# Patient Record
Sex: Female | Born: 1995 | Race: Black or African American | Hispanic: No | Marital: Single
Health system: Southern US, Community
[De-identification: ages and names within clinical notes are randomized; demographics above are authoritative.]

## PROBLEM LIST (undated history)

## (undated) DIAGNOSIS — Z8614 Personal history of Methicillin resistant Staphylococcus aureus infection: Secondary | ICD-10-CM

## (undated) DIAGNOSIS — Z789 Other specified health status: Secondary | ICD-10-CM

## (undated) DIAGNOSIS — S61219A Laceration without foreign body of unspecified finger without damage to nail, initial encounter: Secondary | ICD-10-CM

## (undated) HISTORY — PX: NO PAST SURGERIES: SHX2092

---
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
## 2015-10-14 ENCOUNTER — Ambulatory Visit (HOSPITAL_COMMUNITY)
Admission: EM | Admit: 2015-10-14 | Discharge: 2015-10-14 | Disposition: A | Discharge status: Home / Self Care | Payer: Commercial Managed Care - HMO | Attending: Emergency Medicine | Admitting: Emergency Medicine

## 2015-10-14 ENCOUNTER — Encounter (HOSPITAL_COMMUNITY): Payer: Self-pay | Admitting: *Deleted

## 2015-10-14 DIAGNOSIS — Z9889 Other specified postprocedural states: Secondary | ICD-10-CM | POA: Diagnosis not present

## 2015-10-14 DIAGNOSIS — K122 Cellulitis and abscess of mouth: Secondary | ICD-10-CM

## 2015-10-14 LAB — POCT RAPID STREP A: STREPTOCOCCUS, GROUP A SCREEN (DIRECT): NEGATIVE

## 2015-10-14 NOTE — ED Notes (Signed)
Pt  Was  Noted    Walking  Out    Of  The  Dept      After  The  Provider   Had  Seen  And   Spoke with  Her  About the  Plan of care

## 2015-10-14 NOTE — ED Provider Notes (Signed)
CSN: [id number]     Arrival date & time 10/14/15  1229 History   First MD Initiated Contact with Patient 10/14/15 1350     Chief Complaint  Patient presents with  . Dental Problem   (Consider location/radiation/quality/duration/timing/severity/associated sxs/prior Treatment) Patient presents today for Uvula pain. Her uvula pain started this morning. Patient woke up this morning with a weird taste in her mouth so she took a tongue scraper to clean her tongue and accidentally hit one of her tonsils (and maybe her uvula as well) during this process. She then started to spit up and then vomited once. She reports  that her uvula pain is better and pain is now 6/10 constant. Pain is worst during swallowing. She also endorses abdominal pain and headache this morning but both symptoms have subsided now.       Past Medical History:  Diagnosis Date  . History of MRSA infection ~ 2008   arm  . Laceration of finger of left hand with tendon involvement 09/10/2014   nerve/artery/tendon involvement  . Medical history non-contributory    Past Surgical History:  Procedure Laterality Date  . NERVE, TENDON AND ARTERY REPAIR Left 09/16/2014   Procedure: LEFT INDEX FINGER REPAIR TENDON, ARTERY AND NERVE;  Surgeon: Betha Loa, MD;  Location: [facility];  Service: Orthopedics;  Laterality: Left;  . NO PAST SURGERIES     History reviewed. No pertinent family history. Social History  Substance Use Topics  . Smoking status: Unknown If Ever Smoked  . Smokeless tobacco: Never Used  . Alcohol use No   OB History    No data available     Review of Systems  Constitutional: Negative for chills, fatigue and fever.  HENT: Positive for sore throat. Negative for congestion and dental problem.        Positive for uvula pain, swelling and redness   Respiratory: Negative for cough and shortness of breath.   Cardiovascular: Negative for chest pain.  Gastrointestinal: Positive for abdominal pain  and vomiting. Negative for nausea.       One episode of vomiting, abdominal pain subsided   Neurological: Positive for headaches.       Headache subsided     Allergies  Review of patient's allergies indicates no known allergies.  Home Medications   Prior to Admission medications   Not on File   Meds Ordered and Administered this Visit  Medications - No data to display  BP 104/69 (BP Location: Left Arm)   Pulse 61   Temp 98.5 F (36.9 C) (Oral)   Resp 12   LMP 10/09/2015   SpO2 99%  No data found.   Physical Exam  Constitutional: She is oriented to person, place, and time. She appears well-developed and well-nourished.  HENT:  Head: Normocephalic and atraumatic.  Ear canals clear, TM pearly gray without erythema, Uvula noted to be red and slightly swollen, mild pharyngeal erythema also noted. Tonsils 2+ without swelling or exudate.   Eyes: Conjunctivae and EOM are normal. Pupils are equal, round, and reactive to light.  Neck: Normal range of motion. Neck supple.  Cardiovascular: Normal rate, regular rhythm and normal heart sounds.   Pulmonary/Chest: Effort normal and breath sounds normal.  Abdominal: Soft. Bowel sounds are normal.  Lymphadenopathy:    She has no cervical adenopathy.  Neurological: She is alert and oriented to person, place, and time.  Skin: Skin is warm and dry.    Urgent Care Course   Clinical Course  Procedures (including critical care time)  Labs Review Labs Reviewed  POCT RAPID STREP A    Imaging Review No results found.   Visual Acuity Review  Right Eye Distance:   Left Eye Distance:   Bilateral Distance:    Right Eye Near:   Left Eye Near:    Bilateral Near:         MDM   1. Uvulitis    Patient presents today for sore throat and uvula pain. Her uvula was noted to be red and mildly swollen on examination. Rapid strep was negative. Etiology is either viral or trauma from the tongue scraper. Patient educated to treat  pain with honey, salt-water gargle, or throat lozenges.  Instructed to follow up with PCP if she does not improve.    [name], NP 10/14/15 1506

## 2015-10-16 LAB — CULTURE, GROUP A STREP (THRC)

## 2015-12-06 ENCOUNTER — Ambulatory Visit (HOSPITAL_COMMUNITY)
Admission: EM | Admit: 2015-12-06 | Discharge: 2015-12-06 | Disposition: A | Discharge status: Home / Self Care | Payer: Commercial Managed Care - HMO | Attending: Family Medicine | Admitting: Family Medicine

## 2015-12-06 ENCOUNTER — Encounter (HOSPITAL_COMMUNITY): Payer: Self-pay | Admitting: Emergency Medicine

## 2015-12-06 DIAGNOSIS — N390 Urinary tract infection, site not specified: Secondary | ICD-10-CM | POA: Insufficient documentation

## 2015-12-06 DIAGNOSIS — R350 Frequency of micturition: Secondary | ICD-10-CM | POA: Diagnosis present

## 2015-12-06 LAB — POCT URINALYSIS DIP (DEVICE)
BILIRUBIN URINE: NEGATIVE
Glucose, UA: NEGATIVE mg/dL
KETONES UR: NEGATIVE mg/dL
NITRITE: NEGATIVE
Protein, ur: NEGATIVE mg/dL
Specific Gravity, Urine: <=1.005 (ref 1.005–1.030)
Urobilinogen, UA: 0.2 mg/dL (ref 0.0–1.0)
pH: 5.5 (ref 5.0–8.0)

## 2015-12-06 MED ORDER — NITROFURANTOIN MONOHYD MACRO 100 MG PO CAPS: 100.0000 mg | ORAL_CAPSULE | Freq: Two times a day (BID) | ORAL | 0 refills | Status: AC

## 2015-12-06 NOTE — ED Triage Notes (Signed)
Pt. Stated, Sandra Atlasve been going to bathroom a lot but no pain.

## 2015-12-06 NOTE — ED Provider Notes (Signed)
CSN: [id number]     Arrival date & time 12/06/15  1927 History   First MD Initiated Contact with Patient 12/06/15 2010     Chief Complaint  Patient presents with  . Urinary Tract Infection  . Urinary Frequency   (Consider location/radiation/quality/duration/timing/severity/associated sxs/prior Treatment) Ms. Sandra Martinez is a well-appearing 20 y.o female, presents today for urinary frequency onset 3 days ago accompany by urinary urgency. She denies dysuria, vaginal discharge or flank pain. She reports currently on her menstrual cycle. She have tried OTC AZO with no relief. She is afebrile.       Past Medical History:  Diagnosis Date  . History of MRSA infection ~ 2008   arm  . Laceration of finger of left hand with tendon involvement 09/10/2014   nerve/artery/tendon involvement  . Medical history non-contributory    Past Surgical History:  Procedure Laterality Date  . NERVE, TENDON AND ARTERY REPAIR Left 09/16/2014   Procedure: LEFT INDEX FINGER REPAIR TENDON, ARTERY AND NERVE;  Surgeon: Betha LoaKevin Kuzma, MD;  Location: [facility];  Service: Orthopedics;  Laterality: Left;  . NO PAST SURGERIES     No family history on file. Social History  Substance Use Topics  . Smoking status: Unknown If Ever Smoked  . Smokeless tobacco: Never Used  . Alcohol use No   OB History    No data available     Review of Systems  Constitutional: Negative for chills and fever.  Respiratory: Negative for cough and shortness of breath.   Cardiovascular: Negative for chest pain and palpitations.  Gastrointestinal: Negative for abdominal pain, constipation, diarrhea and rectal pain.  Genitourinary: Positive for frequency, urgency and vaginal bleeding. Negative for dysuria, flank pain, vaginal discharge and vaginal pain.       +on menstrual cycle.   Neurological: Negative for dizziness and headaches.    Allergies  Review of patient's allergies indicates no known allergies.  Home Medications    Prior to Admission medications   Medication Sig Start Date End Date Taking? Authorizing Provider  nitrofurantoin, macrocrystal-monohydrate, (MACROBID) 100 MG capsule Take 1 capsule (100 mg total) by mouth 2 (two) times daily. 12/06/15 12/11/15  Lucia EstelleFeng Khaliah Barnick, NP   Meds Ordered and Administered this Visit  Medications - No data to display  BP 115/66 (BP Location: Right Arm)   Pulse 76   Temp 98.8 F (37.1 C) (Oral)   Resp 17   LMP 12/06/2015   SpO2 98%  No data found.   Physical Exam  Constitutional: She is oriented to person, place, and time. She appears well-developed.  HENT:  Head: Normocephalic and atraumatic.  Cardiovascular: Normal rate, regular rhythm and normal heart sounds.   Pulmonary/Chest: Effort normal and breath sounds normal. No respiratory distress. She has no wheezes.  Abdominal: Soft. Bowel sounds are normal. There is no tenderness.  Genitourinary:  Genitourinary Comments: -CVA tenderness  Neurological: She is alert and oriented to person, place, and time.  Skin: Skin is warm and dry.  Nursing note and vitals reviewed.   Urgent Care Course   Clinical Course    Procedures (including critical care time)  Labs Review Labs Reviewed  POCT URINALYSIS DIP (DEVICE) - Abnormal; Notable for the following:       Result Value   Hgb urine dipstick LARGE (*)    Leukocytes, UA SMALL (*)    All other components within normal limits  URINE CULTURE    Imaging Review No results found.     MDM   1.  UTI (lower urinary tract infection)    UA indicatives UTI. Large presence of hgb is due to her being on menstrual cycle. Urine culture pending. Will tx with macrobid BID x 5 days. All questions answered. Discharge instruction given.     [name], NP 12/06/15 2036

## 2015-12-08 LAB — URINE CULTURE: CULTURE: NO GROWTH

## 2016-01-17 ENCOUNTER — Emergency Department (HOSPITAL_COMMUNITY): Payer: Commercial Managed Care - HMO

## 2016-01-17 ENCOUNTER — Other Ambulatory Visit: Payer: Self-pay | Admitting: *Deleted

## 2016-01-17 ENCOUNTER — Encounter (HOSPITAL_COMMUNITY): Payer: Self-pay | Admitting: Emergency Medicine

## 2016-01-17 ENCOUNTER — Emergency Department (HOSPITAL_COMMUNITY)
Admission: EM | Admit: 2016-01-17 | Discharge: 2016-01-17 | Disposition: A | Discharge status: Home / Self Care | Payer: Commercial Managed Care - HMO | Attending: Emergency Medicine | Admitting: Emergency Medicine

## 2016-01-17 ENCOUNTER — Encounter (HOSPITAL_COMMUNITY): Payer: Self-pay | Admitting: *Deleted

## 2016-01-17 ENCOUNTER — Ambulatory Visit (HOSPITAL_COMMUNITY)
Admission: EM | Admit: 2016-01-17 | Discharge: 2016-01-17 | Disposition: A | Discharge status: Home / Self Care | Payer: Commercial Managed Care - HMO | Attending: Emergency Medicine | Admitting: Emergency Medicine

## 2016-01-17 DIAGNOSIS — R1011 Right upper quadrant pain: Secondary | ICD-10-CM | POA: Diagnosis present

## 2016-01-17 DIAGNOSIS — M25511 Pain in right shoulder: Secondary | ICD-10-CM | POA: Diagnosis not present

## 2016-01-17 DIAGNOSIS — R0789 Other chest pain: Secondary | ICD-10-CM | POA: Insufficient documentation

## 2016-01-17 LAB — COMPREHENSIVE METABOLIC PANEL
ALK PHOS: 65 U/L (ref 38–126)
ALT: 11 U/L — AB (ref 14–54)
AST: 14 U/L — ABNORMAL LOW (ref 15–41)
Albumin: 4.1 g/dL (ref 3.5–5.0)
Anion gap: 7 (ref 5–15)
BUN: 5 mg/dL — ABNORMAL LOW (ref 6–20)
CALCIUM: 9.5 mg/dL (ref 8.9–10.3)
CO2: 28 mmol/L (ref 22–32)
CREATININE: 0.88 mg/dL (ref 0.44–1.00)
Chloride: 104 mmol/L (ref 101–111)
Glucose, Bld: 115 mg/dL — ABNORMAL HIGH (ref 65–99)
Potassium: 3.7 mmol/L (ref 3.5–5.1)
Sodium: 139 mmol/L (ref 135–145)
TOTAL PROTEIN: 7.4 g/dL (ref 6.5–8.1)
Total Bilirubin: 1.1 mg/dL (ref 0.3–1.2)

## 2016-01-17 LAB — CBC
HCT: 39.4 % (ref 36.0–46.0)
Hemoglobin: 12.7 g/dL (ref 12.0–15.0)
MCH: 28.5 pg (ref 26.0–34.0)
MCHC: 32.2 g/dL (ref 30.0–36.0)
MCV: 88.5 fL (ref 78.0–100.0)
PLATELETS: 231 10*3/uL (ref 150–400)
RBC: 4.45 MIL/uL (ref 3.87–5.11)
RDW: 13.5 % (ref 11.5–15.5)
WBC: 10.3 10*3/uL (ref 4.0–10.5)

## 2016-01-17 LAB — URINE MICROSCOPIC-ADD ON: RBC / HPF: NONE SEEN RBC/hpf (ref 0–5)

## 2016-01-17 LAB — URINALYSIS, ROUTINE W REFLEX MICROSCOPIC
Bilirubin Urine: NEGATIVE
Glucose, UA: NEGATIVE mg/dL
Hgb urine dipstick: NEGATIVE
Ketones, ur: 15 mg/dL — AB
Nitrite: NEGATIVE
PROTEIN: NEGATIVE mg/dL
Specific Gravity, Urine: 1.022 (ref 1.005–1.030)
pH: 6 (ref 5.0–8.0)

## 2016-01-17 LAB — LIPASE, BLOOD: LIPASE: 20 U/L (ref 11–51)

## 2016-01-17 MED ORDER — IBUPROFEN 400 MG PO TABS: 400.0000 mg | ORAL_TABLET | Freq: Three times a day (TID) | ORAL | 0 refills | Status: AC

## 2016-01-17 MED ORDER — OXYCODONE-ACETAMINOPHEN 5-325 MG PO TABS
1.0000 | ORAL_TABLET | ORAL | Status: DC | PRN
  Filled 2016-01-17: qty 1

## 2016-01-17 NOTE — ED Provider Notes (Signed)
CSN: [id number]     Arrival date & time 01/17/16  1231 History   None    Chief Complaint  Patient presents with  . Chest Pain  . Shoulder Pain   (Consider location/radiation/quality/duration/timing/severity/associated sxs/prior Treatment) Patient presents with c/o severe right upper quadrant abdominal pain and right shoulder pain that started last night.  She states it started 5 days ago and became worse last night.  She states she had hard time sleeping.  She denies nausea.   The history is provided by the patient.  Chest Pain  Chest pain location: RUQ. Pain quality: aching   Pain radiates to:  R shoulder Pain severity:  Severe Duration:  1 day Timing:  Constant Progression:  Worsening Chronicity:  New Context: breathing and movement   Worsened by:  Deep breathing Associated symptoms: abdominal pain and fever   Shoulder Pain  Associated symptoms: fever     Past Medical History:  Diagnosis Date  . History of MRSA infection ~ 2008   arm  . Laceration of finger of left hand with tendon involvement 09/10/2014   nerve/artery/tendon involvement  . Medical history non-contributory    Past Surgical History:  Procedure Laterality Date  . NERVE, TENDON AND ARTERY REPAIR Left 09/16/2014   Procedure: LEFT INDEX FINGER REPAIR TENDON, ARTERY AND NERVE;  Surgeon: Betha LoaKevin Kuzma, MD;  Location: [facility];  Service: Orthopedics;  Laterality: Left;  . NO PAST SURGERIES     History reviewed. No pertinent family history. Social History  Substance Use Topics  . Smoking status: Unknown If Ever Smoked  . Smokeless tobacco: Never Used  . Alcohol use No   OB History    No data available     Review of Systems  Constitutional: Positive for fever.  Eyes: Negative.   Cardiovascular: Positive for chest pain.  Gastrointestinal: Positive for abdominal pain.  Endocrine: Negative.   Genitourinary: Negative.   Musculoskeletal: Positive for myalgias.  Allergic/Immunologic:  Negative.   Neurological: Negative.   Hematological: Negative.   Psychiatric/Behavioral: Negative.     Allergies  Review of patient's allergies indicates no known allergies.  Home Medications   Prior to Admission medications   Not on File   Meds Ordered and Administered this Visit  Medications - No data to display  BP (!) 109/54 (BP Location: Left Arm)   Pulse 71   Temp 98 F (36.7 C) (Oral)   Resp 18   LMP 01/03/2016 (Exact Date)   SpO2 100%  No data found.   Physical Exam  Constitutional: She appears well-developed and well-nourished.  HENT:  Head: Normocephalic and atraumatic.  Eyes: Conjunctivae and EOM are normal. Pupils are equal, round, and reactive to light.  Neck: Normal range of motion. Neck supple.  Cardiovascular: Normal rate, regular rhythm and normal heart sounds.   Pulmonary/Chest: Effort normal and breath sounds normal.  Abdominal: There is tenderness. There is guarding.  Positive Murphy's  Musculoskeletal:  No focal tenderness with palpation right shoulder.  Nursing note and vitals reviewed.   Urgent Care Course   Clinical Course    Procedures (including critical care time)  Labs Review Labs Reviewed - No data to display  Imaging Review No results found.   Visual Acuity Review  Right Eye Distance:   Left Eye Distance:   Bilateral Distance:    Right Eye Near:   Left Eye Near:    Bilateral Near:         MDM   1. Right upper quadrant abdominal  pain    Recommend transfer to ED for possible cholycystitis     Deatra CanterWilliam J Kevin Space, FNP 01/17/16 1349

## 2016-01-17 NOTE — ED Notes (Signed)
Pt now obtaining urine sample at triage. Is tearful, states that pain has increased.

## 2016-01-17 NOTE — ED Notes (Signed)
Pt in ultrasound

## 2016-01-17 NOTE — Discharge Instructions (Signed)
As discussed, your evaluation today has been largely reassuring.  But, it is important that you monitor your condition carefully, and do not hesitate to return to the ED if you develop new, or concerning changes in your condition. ? ?Otherwise, please follow-up with your physician for appropriate ongoing care. ? ?

## 2016-01-17 NOTE — ED Triage Notes (Signed)
[facility] a [facility] of right side rib pain and right side shoulder pain that started 5 days ago. The patient is unaware of any known injury to those areas.

## 2016-01-17 NOTE — ED Triage Notes (Signed)
Pt sent here from u[facility], reports right side rib pain and right shoulder pain x 5 days. Denies n/v/d. Pain increases with movement. Denies injury to ribs. Sent here for further eval and check gallbladder. No acute distress noted at triage.

## 2016-01-17 NOTE — ED Notes (Signed)
Pt had stated unable to obtain urine sample at triage.

## 2016-01-17 NOTE — ED Provider Notes (Signed)
[facility] Provider Note   CSN: [id number] Arrival date & time: 01/17/16  1358     History   Chief Complaint Chief Complaint  Patient presents with  . Abdominal Pain    HPI Sandra Martinez is a 20 y.o. female.  HPI  Patient presents with concern of right upper quadrant, right lower chest wall pain as well as shoulder pain. Patient developed pain about 6 days ago, since onset has been persistent, with new pain in the right shoulder as well. Pain is worse with deep inspiration and the patient feels no dyspnea at rest. No other chest pain, no other abdominal pain, nausea, no vomiting. No fever, no chills. 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 No relief with 2 doses of ibuprofen.   Past Medical History:  Diagnosis Date  . History of MRSA infection ~ 2008   arm  . Laceration of finger of left hand with tendon involvement 09/10/2014   nerve/artery/tendon involvement  . Medical history non-contributory     There are no active problems to display for this patient.   Past Surgical History:  Procedure Laterality Date  . NERVE, TENDON AND ARTERY REPAIR Left 09/16/2014   Procedure: LEFT INDEX FINGER REPAIR TENDON, ARTERY AND NERVE;  Surgeon: Betha LoaKevin Kuzma, MD;  Location: [facility];  Service: Orthopedics;  Laterality: Left;  . NO PAST SURGERIES      OB History    No data available       Home Medications    Prior to Admission medications   Not on File    Family History History reviewed. No pertinent family history.  Social History Social History  Substance Use Topics  . Smoking status: Unknown If Ever Smoked  . Smokeless tobacco: Never Used  . Alcohol use No     Allergies   Review of patient's allergies indicates no known allergies.   Review of Systems Review of Systems  Constitutional:       Per HPI, otherwise negative  HENT:       Per HPI,  otherwise negative  Respiratory:       Per HPI, otherwise negative  Cardiovascular:       Per HPI, otherwise negative  Gastrointestinal: Negative for vomiting.  Endocrine:       Negative aside from HPI  Genitourinary:       Neg aside from HPI   Musculoskeletal:       Per HPI, otherwise negative  Skin: Negative.   Neurological: Negative for syncope.     Physical Exam Updated Vital Signs BP 110/59 (BP Location: Left Arm)   Pulse 60   Temp 98.5 F (36.9 C) (Oral)   Resp 19   LMP 01/03/2016 (Exact Date)   SpO2 98%   Physical Exam  Constitutional: She is oriented to person, place, and time. She appears well-developed and well-nourished. No distress.  HENT:  Head: Normocephalic and atraumatic.  Eyes: Conjunctivae and EOM are normal.  Cardiovascular: Normal rate and regular rhythm.   Pulmonary/Chest: Effort normal and breath sounds normal. No stridor. No respiratory distress.  Abdominal: She exhibits no distension. There is tenderness in the right upper quadrant.  Musculoskeletal: She exhibits no edema.  Neurological: She is alert and oriented to person, place, and time. No cranial nerve deficit.  Skin: Skin is warm and dry.  Psychiatric: She has a normal mood and affect.  Nursing note and vitals reviewed.  ED Treatments / Results  Labs (all labs ordered are listed, but only abnormal results are displayed) Labs Reviewed  COMPREHENSIVE METABOLIC PANEL - Abnormal; Notable for the following:       Result Value   Glucose, Bld 115 (*)    BUN 5 (*)    AST 14 (*)    ALT 11 (*)    All other components within normal limits  LIPASE, BLOOD  CBC  URINALYSIS, ROUTINE W REFLEX MICROSCOPIC (NOT AT [facility])  I-STAT BETA HCG BLOOD, ED ([facility], [facility], [facility] ONLY)   Radiology Dg Chest 2 View  Result Date: 01/17/2016 CLINICAL DATA:  Right upper quadrant and shoulder pain for 5 days EXAM: CHEST  2 VIEW COMPARISON:  None. FINDINGS: The heart size and mediastinal contours are within normal  limits. Both lungs are clear. The visualized skeletal structures are unremarkable. IMPRESSION: No active cardiopulmonary disease. Electronically Signed   By: Jasmine PangKim  Fujinaga M.D.   On: 01/17/2016 18:30   [place] Abdomen Limited  Result Date: 01/17/2016 CLINICAL DATA:  Right upper quadrant pain and right chest pain for 3 days. EXAM: US ABDOMEN LIMITED - RIGHT UPPER QUADRANT COMPARISON:  None. FINDINGS: Gallbladder: No gallstones or wall thickening visualized. No sonographic Murphy sign noted by sonographer. Common bile duct: Diameter: 2 mm, within normal limits. Liver: No focal lesion identified. Within normal limits in parenchymal echogenicity. IMPRESSION: Negative.  No evidence of gallstones or biliary dilatation. Electronically Signed   By: Myles RosenthalJohn  Stahl M.D.   On: 01/17/2016 18:37    Procedures Procedures (including critical care time)  Medications Ordered in ED Medications  oxyCODONE-acetaminophen (PERCOCET/ROXICET) 5-325 MG per tablet 1 tablet (not administered)     Initial Impression / Assessment and Plan / ED Course  I have reviewed the triage vital signs and the nursing notes.  Pertinent labs & imaging results that were available during my care of the patient were reviewed by me and considered in my medical decision making (see chart for details).  Clinical Course    7:08 PM Patient in no distress. I discussed all findings with her and her family member. Specifically we discussed the reassuring ultrasound, labs, x-ray. I discussed the importance of following up as an outpatient, rest, cryotherapy.   Final Clinical Impressions(s) / ED Diagnoses   Final diagnoses:  RUQ abdominal pain  Acute pain of right shoulder    New Prescriptions New Prescriptions   IBUPROFEN (ADVIL,MOTRIN) 400 MG TABLET    Take 1 tablet (400 mg total) by mouth 3 (three) times daily.    Female presents with ongoing right upper quadrant, right lower chest wall pain as well as new shoulder pain. Patient is  awake, alert, in no distress, but does have notable tenderness with guarding in the right upper quadrant. Given the right shoulder pain, there is some suspicion for referred pain from hepatic or gallbladder pathology, with diaphragmatic irritation. Additional considerations include pleural effusion, given the patient's description of pain in her right lower chest wall, halting respiration. However, the patient's valuation here, was entirely reassuring, no evidence for pleural effusion, hepatobiliary dysfunction, unremarkable liver, gallbladder studies. Patient discharged stable condition with anti-inflammatories for presumed musculoskeletal etiology.    Gerhard Munchobert Latesia Norrington, MD 01/17/16 1910

## 2016-01-18 LAB — I-STAT BETA HCG BLOOD, ED (MC, WL, AP ONLY): I-stat hCG, quantitative: <5 m[IU]/mL (ref <5)

## 2016-03-25 ENCOUNTER — Ambulatory Visit (HOSPITAL_COMMUNITY)
Admission: EM | Admit: 2016-03-25 | Discharge: 2016-03-25 | Disposition: A | Discharge status: Home / Self Care | Payer: Commercial Managed Care - HMO | Attending: Emergency Medicine | Admitting: Emergency Medicine

## 2016-03-25 ENCOUNTER — Encounter (HOSPITAL_COMMUNITY): Payer: Self-pay | Admitting: Emergency Medicine

## 2016-03-25 DIAGNOSIS — J069 Acute upper respiratory infection, unspecified: Secondary | ICD-10-CM

## 2016-03-25 DIAGNOSIS — B9789 Other viral agents as the cause of diseases classified elsewhere: Secondary | ICD-10-CM

## 2016-03-25 MED ORDER — BENZONATATE 100 MG PO CAPS: 100.0000 mg | ORAL_CAPSULE | Freq: Three times a day (TID) | ORAL | 0 refills | Status: DC

## 2016-03-25 NOTE — ED Triage Notes (Signed)
[facility] a complaint of a cough, sweats and general body aches and fatigue x 3 days.

## 2016-03-25 NOTE — Discharge Instructions (Signed)
Rest, drink plenty of fluids, take tylenol ever 4 hours for fever or motrin ever 6, you may use the antihistamine of your choice for congestion, and you have been prescribed a medicine called tessalon for cough. Should your symptoms fail to improve or worsen follow up with your primary care provider or return to clinic.

## 2016-03-25 NOTE — ED Provider Notes (Signed)
CSN: [id number]     Arrival date & time 03/25/16  1221 History   First MD Initiated Contact with Patient 03/25/16 1357     Chief Complaint  Patient presents with  . Cough   (Consider location/radiation/quality/duration/timing/severity/associated sxs/prior Treatment) 21 year old female presents to clinic with 3 day history of fever, body aches fatigue, and cough along with congestion. She denies nausea, vomiting, or diarrhea. Also denies sinus pain and sinus congestion. Cough is non-productive, sputum is clear, she describes her cough as hacking. She does not smoke and is not asthmatic.   The history is provided by the patient.  Cough  Associated symptoms: chills, fever, myalgias and rhinorrhea   Associated symptoms: no ear pain, no shortness of breath, no sore throat and no wheezing     Past Medical History:  Diagnosis Date  . History of MRSA infection ~ 2008   arm  . Laceration of finger of left hand with tendon involvement 09/10/2014   nerve/artery/tendon involvement  . Medical history non-contributory    Past Surgical History:  Procedure Laterality Date  . NERVE, TENDON AND ARTERY REPAIR Left 09/16/2014   Procedure: LEFT INDEX FINGER REPAIR TENDON, ARTERY AND NERVE;  Surgeon: Betha Loa, MD;  Location: [facility];  Service: Orthopedics;  Laterality: Left;  . NO PAST SURGERIES     History reviewed. No pertinent family history. Social History  Substance Use Topics  . Smoking status: Unknown If Ever Smoked  . Smokeless tobacco: Never Used  . Alcohol use No   OB History    No data available     Review of Systems  Constitutional: Positive for chills, fatigue and fever.  HENT: Positive for congestion and rhinorrhea. Negative for ear discharge, ear pain, sinus pain, sinus pressure and sore throat.   Respiratory: Positive for cough. Negative for shortness of breath and wheezing.   Cardiovascular: Negative.   Gastrointestinal: Negative.   Musculoskeletal:  Positive for myalgias.  Neurological: Negative.     Allergies  Patient has no known allergies.  Home Medications   Prior to Admission medications   Medication Sig Start Date End Date Taking? Authorizing Provider  benzonatate (TESSALON) 100 MG capsule Take 1 capsule (100 mg total) by mouth every 8 (eight) hours. 03/25/16   Dorena Bodo, NP   Meds Ordered and Administered this Visit  Medications - No data to display  BP 119/73 (BP Location: Left Arm)   Pulse 98   Temp 99.7 F (37.6 C) (Oral)   Resp 16   LMP 03/04/2016 (Approximate)   SpO2 99%  No data found.   Physical Exam  Constitutional: She is oriented to person, place, and time. She appears well-developed and well-nourished. She appears ill. No distress.  HENT:  Head: Normocephalic.  Right Ear: Tympanic membrane and external ear normal.  Left Ear: Tympanic membrane and external ear normal.  Nose: Rhinorrhea present. Right sinus exhibits no maxillary sinus tenderness and no frontal sinus tenderness. Left sinus exhibits no maxillary sinus tenderness and no frontal sinus tenderness.  Mouth/Throat: Oropharynx is clear and moist. No oropharyngeal exudate.  Eyes: Pupils are equal, round, and reactive to light.  Neck: Normal range of motion. Neck supple. No JVD present.  Cardiovascular: Normal rate and regular rhythm.   Pulmonary/Chest: Effort normal.  Abdominal: Soft. Bowel sounds are normal.  Lymphadenopathy:    She has no cervical adenopathy.  Neurological: She is alert and oriented to person, place, and time.  Skin: Skin is warm and dry. Capillary refill  takes less than 2 seconds. She is not diaphoretic.  Psychiatric: She has a normal mood and affect.  Nursing note and vitals reviewed.   Urgent Care Course   Clinical Course     Procedures (including critical care time)  Labs Review Labs Reviewed - No data to display  Imaging Review No results found.   Visual Acuity Review  Right Eye Distance:   Left  Eye Distance:   Bilateral Distance:    Right Eye Near:   Left Eye Near:    Bilateral Near:         MDM   1. Viral URI with cough    Patient advised to drink plenty of fluids and rest. She was given Tessalon for cough and encouraged to continue taking Tylenol ever 4 hours for fever and body aches. Should symptoms worsen or fail to improve follow up with PCP or return to clinic.     Dorena BodoLawrence Ronell Duffus, NP 03/25/16 1436

## 2017-03-31 ENCOUNTER — Encounter (HOSPITAL_COMMUNITY): Payer: Self-pay | Admitting: Emergency Medicine

## 2017-03-31 ENCOUNTER — Ambulatory Visit (HOSPITAL_COMMUNITY)
Admission: EM | Admit: 2017-03-31 | Discharge: 2017-03-31 | Disposition: A | Discharge status: Home / Self Care | Payer: 59 | Attending: Internal Medicine | Admitting: Internal Medicine

## 2017-03-31 ENCOUNTER — Ambulatory Visit (INDEPENDENT_AMBULATORY_CARE_PROVIDER_SITE_OTHER): Payer: 59

## 2017-03-31 DIAGNOSIS — R1011 Right upper quadrant pain: Secondary | ICD-10-CM | POA: Diagnosis not present

## 2017-03-31 DIAGNOSIS — Z3202 Encounter for pregnancy test, result negative: Secondary | ICD-10-CM | POA: Diagnosis not present

## 2017-03-31 DIAGNOSIS — K5900 Constipation, unspecified: Secondary | ICD-10-CM | POA: Diagnosis not present

## 2017-03-31 LAB — POCT URINALYSIS DIP (DEVICE)
Bilirubin Urine: NEGATIVE
GLUCOSE, UA: NEGATIVE mg/dL
KETONES UR: NEGATIVE mg/dL
Leukocytes, UA: NEGATIVE
Nitrite: NEGATIVE
PROTEIN: NEGATIVE mg/dL
Urobilinogen, UA: 0.2 mg/dL (ref 0.0–1.0)
pH: 6 (ref 5.0–8.0)

## 2017-03-31 LAB — POCT PREGNANCY, URINE: Preg Test, Ur: NEGATIVE

## 2017-03-31 MED ORDER — POLYETHYLENE GLYCOL 3350 17 G PO PACK: 17.0000 g | PACK | Freq: Every day | ORAL | 0 refills | Status: DC

## 2017-03-31 NOTE — ED Triage Notes (Signed)
PT reports lower abdominal pain for 1 week. PT reports her menstrual started Saturday.   PT reports she believes she passed a worm in her stool last night.

## 2017-03-31 NOTE — Discharge Instructions (Signed)
Take 1 dose of miralax with 8oz of water. Drink additional 8 oz of water and repeat miralax in 8 oz of water.  Repeat miralax tomorrow morning.  Continue to drink plenty of fluids daily. If your pain does not improve, or if it worsens in the next 24 hours I recommend further evaluation in the emergency department. If you develop fevers, increased, pain, vomiting, blood in stool please go to Er. If you continue to have abnormal stools I recommend establishing with a primary care provider for further evaluation and referral as needed to Gi.

## 2017-03-31 NOTE — ED Provider Notes (Signed)
[facility]    CSN: [id number] Arrival date & time: 03/31/17  1156     History   Chief Complaint Chief Complaint  Patient presents with  . Abdominal Pain    HPI Sandra Martinez is a 22 y.o. female.   [name] presents with complaints of generalized lower abdominal pain which is also to right side, which started 1/8. Has not worsened but has not improved. She states she felt constipated and so she took two tabs of a laxative on 1/9 and had a small bowel movement. She did have to strain to past. Did have a BM today of which she had to strain a lot to pass a hard stool. States she did have slight pressure to the abdomen with urination but this has improved. Otherwise without urinary pain or frequency. LMP 1/12. She states she noted what appeared to be a worm in her stool yesterday and today therefore she is concerned. Mild nausea, without vomiting. Denies vaginal symptoms. Rates pain 9/10. She is eating and drinking. Without fevers, no known ill contacts. States had similar pain 01/2016 and had her gallbladder evaluated, was diagnosed with muscle strain.    ROS per HPI.       Past Medical History:  Diagnosis Date  . History of MRSA infection ~ 2008   arm  . Laceration of finger of left hand with tendon involvement 09/10/2014   nerve/artery/tendon involvement  . Medical history non-contributory     There are no active problems to display for this patient.   Past Surgical History:  Procedure Laterality Date  . NERVE, TENDON AND ARTERY REPAIR Left 09/16/2014   Procedure: LEFT INDEX FINGER REPAIR TENDON, ARTERY AND NERVE;  Surgeon: Betha Loa, MD;  [facility];  Service: Orthopedics;  Laterality: Left;  . NO PAST SURGERIES      OB History    No data available       Home Medications    Prior to Admission medications   Medication Sig Start Date End Date Taking? Authorizing Provider  benzonatate (TESSALON) 100 MG capsule Take 1 capsule  (100 mg total) by mouth every 8 (eight) hours. 03/25/16   Dorena Bodo, NP  polyethylene glycol [facility]) packet Take 17 g by mouth daily. 03/31/17   Georgetta Haber, NP    Family History No family history on file.  Social History Social History   Tobacco Use  . Smoking status: Unknown If Ever Smoked  . Smokeless tobacco: Never Used  Substance Use Topics  . Alcohol use: No  . Drug use: No     Allergies   Patient has no known allergies.   Review of Systems Review of Systems   Physical Exam Triage Vital Signs ED Triage Vitals  Enc Vitals Group     BP 03/31/17 1301 99/64     Pulse Rate 03/31/17 1301 82     Resp 03/31/17 1301 16     Temp 03/31/17 1301 98.9 F (37.2 C)     Temp Source 03/31/17 1301 Oral     SpO2 03/31/17 1301 100 %     Weight 03/31/17 1301 164 lb (74.4 kg)     Height --      Head Circumference --      Peak Flow --      Pain Score 03/31/17 1302 8     Pain Loc --      Pain Edu? --      Excl. in GC? --  No data found.  Updated Vital Signs BP 99/64   Pulse 82   Temp 98.9 F (37.2 C) (Oral)   Resp 16   Wt 164 lb (74.4 kg)   LMP 03/29/2017   SpO2 100%   BMI 29.05 kg/m   Visual Acuity Right Eye Distance:   Left Eye Distance:   Bilateral Distance:    Right Eye Near:   Left Eye Near:    Bilateral Near:     Physical Exam  Constitutional: She is oriented to person, place, and time. She appears well-developed and well-nourished. No distress.  Cardiovascular: Normal rate, regular rhythm and normal heart sounds.  Pulmonary/Chest: Effort normal and breath sounds normal.  Abdominal: There is tenderness in the right upper quadrant, right lower quadrant and suprapubic area. There is no rigidity, no rebound, no guarding and no CVA tenderness.  When left abdomen palpated patient states mild pain to right abdomen   Neurological: She is alert and oriented to person, place, and time.  Skin: Skin is warm and dry.     UC Treatments / Results    Labs (all labs ordered are listed, but only abnormal results are displayed) Labs Reviewed  POCT URINALYSIS DIP (DEVICE) - Abnormal; Notable for the following components:      Result Value   Hgb urine dipstick LARGE (*)    All other components within normal limits  POCT PREGNANCY, URINE    EKG  EKG Interpretation None       Radiology Dg Abd 2 Views  Result Date: 03/31/2017 CLINICAL DATA:  Abdominal pain for 1 week. EXAM: ABDOMEN - 2 VIEW COMPARISON:  None. FINDINGS: The bowel gas pattern is normal. There is no evidence of free air. No radio-opaque calculi or other significant radiographic abnormality is seen. IMPRESSION: Negative exam. Electronically Signed   By: Drusilla Kannerhomas  Dalessio M.D.   On: 03/31/2017 14:07    Procedures Procedures (including critical care time)  Medications Ordered in UC Medications - No data to display   Initial Impression / Assessment and Plan / UC Course  I have reviewed the triage vital signs and the nursing notes.  Pertinent labs & imaging results that were available during my care of the patient were reviewed by me and considered in my medical decision making (see chart for details).     Patient endorses constipation, similar episode of RUQ abdominal pain with negative work up in 2017, patient states she does not want to go to the ER. Miralax and increased water intake recommended to promote large BM. Tylenol, ibuprofen as needed. ER for further evaluation of appendix and/or gallbladder as needed but without red flag findings today. Patient verbalized understanding and agreeable to plan.    Final Clinical Impressions(s) / UC Diagnoses   Final diagnoses:  Right upper quadrant abdominal pain  Constipation, unspecified constipation type    ED Discharge Orders        Ordered    polyethylene glycol (MIRALAX) packet  Daily     03/31/17 1412       Controlled Substance Prescriptions [place] Controlled Substance Registry consulted? Not Applicable    Georgetta HaberBurky, Natalie B, NP 03/31/17 1517

## 2017-04-07 ENCOUNTER — Emergency Department (HOSPITAL_COMMUNITY): Payer: 59

## 2017-04-07 ENCOUNTER — Emergency Department (HOSPITAL_COMMUNITY)
Admission: EM | Admit: 2017-04-07 | Discharge: 2017-04-08 | Disposition: A | Discharge status: Home / Self Care | Payer: 59 | Attending: Emergency Medicine | Admitting: Emergency Medicine

## 2017-04-07 ENCOUNTER — Encounter (HOSPITAL_COMMUNITY): Payer: Self-pay | Admitting: Emergency Medicine

## 2017-04-07 DIAGNOSIS — K5900 Constipation, unspecified: Secondary | ICD-10-CM | POA: Insufficient documentation

## 2017-04-07 DIAGNOSIS — R109 Unspecified abdominal pain: Secondary | ICD-10-CM | POA: Diagnosis present

## 2017-04-07 LAB — I-STAT BETA HCG BLOOD, ED (MC, WL, AP ONLY): I-stat hCG, quantitative: <5 m[IU]/mL (ref <5)

## 2017-04-07 LAB — CBC WITH DIFFERENTIAL/PLATELET
BASOS ABS: 0 10*3/uL (ref 0.0–0.1)
BASOS PCT: 0 %
EOS PCT: 1 %
Eosinophils Absolute: 0.1 10*3/uL (ref 0.0–0.7)
HCT: 37.8 % (ref 36.0–46.0)
Hemoglobin: 12.2 g/dL (ref 12.0–15.0)
Lymphocytes Relative: 18 %
Lymphs Abs: 2.1 10*3/uL (ref 0.7–4.0)
MCH: 28.9 pg (ref 26.0–34.0)
MCHC: 32.3 g/dL (ref 30.0–36.0)
MCV: 89.6 fL (ref 78.0–100.0)
MONO ABS: 0.8 10*3/uL (ref 0.1–1.0)
Monocytes Relative: 7 %
NEUTROS ABS: 8.3 10*3/uL — AB (ref 1.7–7.7)
Neutrophils Relative %: 74 %
PLATELETS: 331 10*3/uL (ref 150–400)
RBC: 4.22 MIL/uL (ref 3.87–5.11)
RDW: 13.3 % (ref 11.5–15.5)
WBC: 11.2 10*3/uL — ABNORMAL HIGH (ref 4.0–10.5)

## 2017-04-07 LAB — URINALYSIS, ROUTINE W REFLEX MICROSCOPIC
BILIRUBIN URINE: NEGATIVE
Glucose, UA: NEGATIVE mg/dL
Hgb urine dipstick: NEGATIVE
KETONES UR: 5 mg/dL — AB
Nitrite: NEGATIVE
PROTEIN: 30 mg/dL — AB
Specific Gravity, Urine: 1.034 — ABNORMAL HIGH (ref 1.005–1.030)
pH: 5 (ref 5.0–8.0)

## 2017-04-07 LAB — COMPREHENSIVE METABOLIC PANEL
ALBUMIN: 3.6 g/dL (ref 3.5–5.0)
ALT: 55 U/L — ABNORMAL HIGH (ref 14–54)
AST: 38 U/L (ref 15–41)
Alkaline Phosphatase: 96 U/L (ref 38–126)
Anion gap: 11 (ref 5–15)
BUN: 8 mg/dL (ref 6–20)
CHLORIDE: 103 mmol/L (ref 101–111)
CO2: 27 mmol/L (ref 22–32)
Calcium: 9.2 mg/dL (ref 8.9–10.3)
Creatinine, Ser: 0.75 mg/dL (ref 0.44–1.00)
GFR calc Af Amer: >60 mL/min (ref >60)
GFR calc non Af Amer: >60 mL/min (ref >60)
GLUCOSE: 86 mg/dL (ref 65–99)
POTASSIUM: 3.8 mmol/L (ref 3.5–5.1)
Sodium: 141 mmol/L (ref 135–145)
Total Bilirubin: 0.5 mg/dL (ref 0.3–1.2)
Total Protein: 7.6 g/dL (ref 6.5–8.1)

## 2017-04-07 LAB — LIPASE, BLOOD: Lipase: 27 U/L (ref 11–51)

## 2017-04-07 MED ORDER — POLYETHYLENE GLYCOL 3350 17 G PO PACK: PACK | ORAL | 0 refills | Status: AC

## 2017-04-07 NOTE — ED Triage Notes (Signed)
Patient reports intermittent left/right lateral abdominal pain with nauaea , denies emesis or diarrhea , pt. added "tape worm"  noted at tissue paper when she wiped this week .

## 2017-04-07 NOTE — Discharge Instructions (Signed)
Recommend using Miralax as directed. Also recommend an over-the-counter stool softener, and use of a Fleets enema if you do not clear your bowels with Miralax use. Return to the emergency department with any worsening symptoms or new concerns.

## 2017-04-07 NOTE — ED Notes (Signed)
Patient ambulatory to the room, getting changed into gown to be seen by provider.  No complaints at this time.

## 2017-04-08 NOTE — ED Notes (Signed)
Patient Alert and oriented X4. Stable and ambulatory. Patient verbalized understanding of the discharge instructions.  Patient belongings were taken by the patient.  

## 2017-04-08 NOTE — ED Provider Notes (Signed)
[facility] EMERGENCY DEPARTMENT Provider Note   CSN: [id number] Arrival date & time: 04/07/17  1940     History   Chief Complaint Chief Complaint  Patient presents with  . Abdominal Pain    " Tape Worm"    HPI Sandra Martinez is a 22 y.o. female.  Patient presents with complaint of abdominal pain. She states the pain is intermittent and can be bilateral sides or across the middle abdomen. No nausea, vomiting, fever, urinary symptoms, vaginal discharge or diarrhea. She reports that she has had some degree of symptoms since eating crawfish 2 months ago. She also reports seeing what she thinks is a worm in her stool. She was seen by Urgent Care 2 days ago and showed the provider a sample and was assured what she saw was "mucus in her stool". No hematochezia or melena.    The history is provided by the patient. No language interpreter was used.    Past Medical History:  Diagnosis Date  . History of MRSA infection ~ 2008   arm  . Laceration of finger of left hand with tendon involvement 09/10/2014   nerve/artery/tendon involvement  . Medical history non-contributory     There are no active problems to display for this patient.   Past Surgical History:  Procedure Laterality Date  . NERVE, TENDON AND ARTERY REPAIR Left 09/16/2014   Procedure: LEFT INDEX FINGER REPAIR TENDON, ARTERY AND NERVE;  Surgeon: Betha Loa, MD;  Location: [facility];  Service: Orthopedics;  Laterality: Left;  . NO PAST SURGERIES      OB History    No data available       Home Medications    Prior to Admission medications   Medication Sig Start Date End Date Taking? Authorizing Provider  benzonatate (TESSALON) 100 MG capsule Take 1 capsule (100 mg total) by mouth every 8 (eight) hours. Patient not taking: Reported on 04/07/2017 03/25/16   Dorena Bodo, NP  polyethylene glycol [facility]) packet Use one dose up to 3 times daily, maximum 3 consecutive days. 04/07/17    Elpidio Anis, PA-C    Family History No family history on file.  Social History Social History   Tobacco Use  . Smoking status: Unknown If Ever Smoked  . Smokeless tobacco: Never Used  Substance Use Topics  . Alcohol use: No  . Drug use: No     Allergies   Patient has no known allergies.   Review of Systems Review of Systems  Constitutional: Negative for chills and fever.  Respiratory: Negative.   Cardiovascular: Negative.   Gastrointestinal: Positive for abdominal pain. Negative for blood in stool, diarrhea, nausea and vomiting.       See HPI.  Genitourinary: Negative.   Musculoskeletal: Negative.   Skin: Negative.   Neurological: Negative.      Physical Exam Updated Vital Signs BP 112/62   Pulse 81   Temp 98.9 F (37.2 C) (Oral)   Resp 16   Ht 5\' 2"  (1.575 m)   Wt 74.4 kg (164 lb)   LMP 03/29/2017   SpO2 96%   BMI 30.00 kg/m   Physical Exam  Constitutional: She appears well-developed and well-nourished.  HENT:  Head: Normocephalic.  Neck: Normal range of motion. Neck supple.  Cardiovascular: Normal rate and regular rhythm.  Pulmonary/Chest: Effort normal and breath sounds normal.  Abdominal: Soft. Bowel sounds are normal. There is no tenderness. There is no rebound and no guarding.  Musculoskeletal: Normal range of  motion.  Neurological: She is alert. No cranial nerve deficit.  Skin: Skin is warm and dry. No rash noted.  Psychiatric: She has a normal mood and affect.     ED Treatments / Results  Labs (all labs ordered are listed, but only abnormal results are displayed) Labs Reviewed  CBC WITH DIFFERENTIAL/PLATELET - Abnormal; Notable for the following components:      Result Value   WBC 11.2 (*)    Neutro Abs 8.3 (*)    All other components within normal limits  COMPREHENSIVE METABOLIC PANEL - Abnormal; Notable for the following components:   ALT 55 (*)    All other components within normal limits  URINALYSIS, ROUTINE W REFLEX  MICROSCOPIC - Abnormal; Notable for the following components:   APPearance HAZY (*)    Specific Gravity, Urine 1.034 (*)    Ketones, ur 5 (*)    Protein, ur 30 (*)    Leukocytes, UA SMALL (*)    Bacteria, UA RARE (*)    Squamous Epithelial / LPF 6-30 (*)    All other components within normal limits  LIPASE, BLOOD  I-STAT BETA HCG BLOOD, ED ([facility], [facility], [facility] ONLY)    EKG  EKG Interpretation None       Radiology Dg Abdomen 1 View  Result Date: 04/07/2017 CLINICAL DATA:  Abdominal pain EXAM: ABDOMEN - 1 VIEW COMPARISON:  03/31/2017 FINDINGS: Nonobstructed bowel-gas pattern with moderate stool in the colon. No abnormal calcifications. IMPRESSION: Negative. Electronically Signed   By: Jasmine PangKim  Fujinaga M.D.   On: 04/07/2017 23:06    Procedures Procedures (including critical care time)  Medications Ordered in ED Medications - No data to display   Initial Impression / Assessment and Plan / ED Course  I have reviewed the triage vital signs and the nursing notes.  Pertinent labs & imaging results that were available during my care of the patient were reviewed by me and considered in my medical decision making (see chart for details).     Patient presents with intermittent, transient abdominal pain for the past 2 months. No fever at any time. She is concerned about seeing worms in her stool.   She reports being treated with Miralax QD for constipation as recommended by Urgent Care. KUB today shows continued constipation. Recommended TID Miralax, stool softener, fleets enema use and a high fiber diet to relieve her symptoms. She was reassured that worms were unlikely.   Final Clinical Impressions(s) / ED Diagnoses   Final diagnoses:  Constipation, unspecified constipation type    ED Discharge Orders        Ordered    polyethylene glycol (MIRALAX) packet     04/07/17 2342       Elpidio AnisUpstill, Layn Kye, PA-C 04/08/17 [id number]    Rolan BuccoBelfi, Melanie, MD 04/08/17 [phone]

## 2017-05-03 ENCOUNTER — Encounter (HOSPITAL_COMMUNITY): Payer: Self-pay | Admitting: Emergency Medicine

## 2017-05-03 ENCOUNTER — Ambulatory Visit (HOSPITAL_COMMUNITY)
Admission: EM | Admit: 2017-05-03 | Discharge: 2017-05-03 | Disposition: A | Discharge status: Home / Self Care | Payer: 59 | Attending: Family Medicine | Admitting: Family Medicine

## 2017-05-03 DIAGNOSIS — K5900 Constipation, unspecified: Secondary | ICD-10-CM | POA: Diagnosis not present

## 2017-05-03 DIAGNOSIS — R110 Nausea: Secondary | ICD-10-CM

## 2017-05-03 DIAGNOSIS — Z3202 Encounter for pregnancy test, result negative: Secondary | ICD-10-CM

## 2017-05-03 LAB — POCT PREGNANCY, URINE: Preg Test, Ur: NEGATIVE

## 2017-05-03 LAB — POCT URINALYSIS DIP (DEVICE)
Glucose, UA: NEGATIVE mg/dL
Ketones, ur: 80 mg/dL — AB
LEUKOCYTES UA: NEGATIVE
NITRITE: NEGATIVE
PH: 6 (ref 5.0–8.0)
Protein, ur: 100 mg/dL — AB
UROBILINOGEN UA: 0.2 mg/dL (ref 0.0–1.0)

## 2017-05-03 MED ORDER — ONDANSETRON 8 MG PO TBDP: 8.0000 mg | ORAL_TABLET | Freq: Three times a day (TID) | ORAL | 0 refills | Status: AC | PRN

## 2017-05-03 MED ORDER — LACTULOSE 20 GM/30ML PO SOLN: ORAL | 1 refills | Status: DC

## 2017-05-03 MED ORDER — BISACODYL 5 MG PO TBEC: 5.0000 mg | DELAYED_RELEASE_TABLET | Freq: Two times a day (BID) | ORAL | 1 refills | Status: AC

## 2017-05-03 NOTE — ED Triage Notes (Signed)
PT C/O: abd pain onset 1 month ++ .... Was seen here on 03/31/17 for similar sx  LBM = was 2 days ago.  Reports she did a pregnancy test last night and it was negative.   LMP = 03/31/17    A&O x4... NAD... Ambulatory

## 2017-05-03 NOTE — ED Provider Notes (Signed)
[facility] CARE CENTER   [id number] 05/03/17 Arrival Time: 1339  ASSESSMENT & PLAN:  1. Constipation, unspecified constipation type     Meds ordered this encounter  Medications  . Lactulose 20 GM/30ML SOLN    Sig: Take 30 ml po bid prn constipation    Dispense:  240 mL    Refill:  1    Order Specific Question:   Supervising Provider    AnswerMardella Layman [[id number]]  . bisacodyl (DULCOLAX) 5 MG EC tablet    Sig: Take 1 tablet (5 mg total) by mouth 2 (two) times daily.    Dispense:  14 tablet    Refill:  1    Order Specific Question:   Supervising Provider    Answer:   Mardella Layman [id number]  . ondansetron (ZOFRAN ODT) 8 MG disintegrating tablet    Sig: Take 1 tablet (8 mg total) by mouth every 8 (eight) hours as needed for nausea or vomiting.    Dispense:  20 tablet    Refill:  0    Order Specific Question:   Supervising Provider    Answer:   Mardella Layman [[id number]]    Reviewed expectations re: course of current medical issues. Questions answered. Outlined signs and symptoms indicating need for more acute intervention. Patient verbalized understanding. After Visit Summary given.   SUBJECTIVE: History from: patient. Sandra Martinez is a 22 y.o. female who presents with complaint of persistent constipation and nausea and vomiting starting yesterday. Reports gradual onset several days ago. Described symptoms have gradually worsened since beginning.  ROS: As per HPI.   OBJECTIVE:  Vitals:   05/03/17 1425  BP: 134/85  Pulse: 90  Resp: 18  Temp: 98.4 F (36.9 C)  TempSrc: Oral  SpO2: 99%    General appearance: alert; no distress Eyes: PERRLA; EOMI; conjunctiva normal HENT: normocephalic; atraumatic; TMs normal; nasal mucosa normal; oral mucosa normal Neck: supple  Lungs: clear to auscultation bilaterally Heart: regular rate and rhythm Abdomen: soft, non-tender; bowel sounds normal; no masses or organomegaly; no guarding or rebound tenderness Back: no CVA  tenderness Extremities: no cyanosis or edema; symmetrical with no gross deformities Skin: warm and dry Neurologic: normal gait; normal symmetric reflexes Psychological: alert and cooperative; normal mood and affect  Labs: Results for orders placed or performed during the hospital encounter of 05/03/17  POCT urinalysis dip (device)  Result Value Ref Range   Glucose, UA NEGATIVE NEGATIVE mg/dL   Bilirubin Urine SMALL (A) NEGATIVE   Ketones, ur 80 (A) NEGATIVE mg/dL   Specific Gravity, Urine >=1.030 1.005 - 1.030   Hgb urine dipstick TRACE (A) NEGATIVE   pH 6.0 5.0 - 8.0   Protein, ur 100 (A) NEGATIVE mg/dL   Urobilinogen, UA 0.2 0.0 - 1.0 mg/dL   Nitrite NEGATIVE NEGATIVE   Leukocytes, UA NEGATIVE NEGATIVE  Pregnancy, urine POC  Result Value Ref Range   Preg Test, Ur NEGATIVE NEGATIVE   Labs Reviewed  POCT URINALYSIS DIP (DEVICE) - Abnormal; Notable for the following components:      Result Value   Bilirubin Urine SMALL (*)    Ketones, ur 80 (*)    Hgb urine dipstick TRACE (*)    Protein, ur 100 (*)    All other components within normal limits  POCT PREGNANCY, URINE    Imaging: No results found.  No Known Allergies  Past Medical History:  Diagnosis Date  . History of MRSA infection ~ 2008   arm  . Laceration of finger  of left hand with tendon involvement 09/10/2014   nerve/artery/tendon involvement  . Medical history non-contributory    Social History   Socioeconomic History  . Marital status: Single    Spouse name: Not on file  . Number of children: Not on file  . Years of education: Not on file  . Highest education level: Not on file  Social Needs  . Financial resource strain: Not on file  . Food insecurity - worry: Not on file  . Food insecurity - inability: Not on file  . Transportation needs - medical: Not on file  . Transportation needs - non-medical: Not on file  Occupational History  . Not on file  Tobacco Use  . Smoking status: Unknown If Ever  Smoked  . Smokeless tobacco: Never Used  Substance and Sexual Activity  . Alcohol use: No  . Drug use: No  . Sexual activity: Yes    Birth control/protection: Rhythm  Other Topics Concern  . Not on file  Social History Narrative  . Not on file   History reviewed. No pertinent family history. Past Surgical History:  Procedure Laterality Date  . NERVE, TENDON AND ARTERY REPAIR Left 09/16/2014   Procedure: LEFT INDEX FINGER REPAIR TENDON, ARTERY AND NERVE;  Surgeon: Betha LoaKevin Kuzma, MD;  Location: [facility];  Service: Orthopedics;  Laterality: Left;  . NO PAST SURGERIES       Deatra CanterOxford, Lakeria Starkman J, FNP 05/03/17 1530

## 2017-05-14 ENCOUNTER — Other Ambulatory Visit: Payer: Self-pay

## 2017-05-14 ENCOUNTER — Encounter (HOSPITAL_COMMUNITY): Payer: Self-pay | Admitting: Emergency Medicine

## 2017-05-14 ENCOUNTER — Emergency Department (HOSPITAL_COMMUNITY): Payer: 59

## 2017-05-14 ENCOUNTER — Emergency Department (HOSPITAL_COMMUNITY)
Admission: EM | Admit: 2017-05-14 | Discharge: 2017-05-14 | Disposition: A | Discharge status: Home / Self Care | Payer: 59 | Attending: Emergency Medicine | Admitting: Emergency Medicine

## 2017-05-14 DIAGNOSIS — R109 Unspecified abdominal pain: Secondary | ICD-10-CM | POA: Diagnosis not present

## 2017-05-14 DIAGNOSIS — Y998 Other external cause status: Secondary | ICD-10-CM | POA: Diagnosis not present

## 2017-05-14 DIAGNOSIS — M25561 Pain in right knee: Secondary | ICD-10-CM | POA: Diagnosis not present

## 2017-05-14 DIAGNOSIS — Y92410 Unspecified street and highway as the place of occurrence of the external cause: Secondary | ICD-10-CM | POA: Diagnosis not present

## 2017-05-14 DIAGNOSIS — Y9389 Activity, other specified: Secondary | ICD-10-CM | POA: Diagnosis not present

## 2017-05-14 DIAGNOSIS — T07XXXA Unspecified multiple injuries, initial encounter: Secondary | ICD-10-CM | POA: Diagnosis not present

## 2017-05-14 DIAGNOSIS — M549 Dorsalgia, unspecified: Secondary | ICD-10-CM | POA: Diagnosis not present

## 2017-05-14 DIAGNOSIS — R6884 Jaw pain: Secondary | ICD-10-CM | POA: Insufficient documentation

## 2017-05-14 DIAGNOSIS — Z79899 Other long term (current) drug therapy: Secondary | ICD-10-CM | POA: Insufficient documentation

## 2017-05-14 DIAGNOSIS — R51 Headache: Secondary | ICD-10-CM | POA: Diagnosis present

## 2017-05-14 DIAGNOSIS — M25562 Pain in left knee: Secondary | ICD-10-CM | POA: Insufficient documentation

## 2017-05-14 MED ORDER — ETODOLAC 400 MG PO TABS: 400.0000 mg | ORAL_TABLET | Freq: Two times a day (BID) | ORAL | 0 refills | Status: AC | PRN

## 2017-05-14 MED ORDER — CYCLOBENZAPRINE HCL 10 MG PO TABS: 10.0000 mg | ORAL_TABLET | Freq: Three times a day (TID) | ORAL | 0 refills | Status: AC | PRN
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
## 2018-06-04 ENCOUNTER — Encounter (HOSPITAL_COMMUNITY): Payer: Self-pay

## 2018-06-04 ENCOUNTER — Other Ambulatory Visit: Payer: Self-pay

## 2018-06-04 ENCOUNTER — Ambulatory Visit (HOSPITAL_COMMUNITY)
Admission: EM | Admit: 2018-06-04 | Discharge: 2018-06-04 | Disposition: A | Discharge status: Home / Self Care | Payer: 59 | Attending: Family Medicine | Admitting: Family Medicine

## 2018-06-04 DIAGNOSIS — J988 Other specified respiratory disorders: Secondary | ICD-10-CM | POA: Diagnosis not present

## 2018-06-04 DIAGNOSIS — B9789 Other viral agents as the cause of diseases classified elsewhere: Secondary | ICD-10-CM | POA: Diagnosis not present

## 2018-06-04 NOTE — ED Triage Notes (Signed)
Pt cc sore throat, coughing  and body aches x 4 days. Pt needs a note to return to work.

## 2018-06-04 NOTE — ED Provider Notes (Signed)
[facility] CARE CENTER   [id number] 06/04/18 Arrival Time: 1341  ASSESSMENT & PLAN:  1. Viral respiratory infection    Discussed typical duration of symptoms. OTC symptom care as needed. Ensure adequate fluid intake and rest. May f/u with PCP or here as needed.  Reviewed expectations re: course of current medical issues. Questions answered. Outlined signs and symptoms indicating need for more acute intervention. Patient verbalized understanding. After Visit Summary given.   SUBJECTIVE: History from: patient.  Sandra Martinez is a 23 y.o. female who reports having URI symptoms 4 d ago; with fatigue and with body aches. SOB: none. Wheezing: none. Fever: she is unsure; temperature not taken. Overall normal PO intake without n/v. Known sick contacts: no. No specific or significant aggravating or alleviating factors reported. OTC treatment: none reported. Current illness has required her to miss work. Requests note to return to work.  Received flu shot this year: no.  Social History   Tobacco Use  Smoking Status Unknown If Ever Smoked  Smokeless Tobacco Never Used    ROS: As per HPI.   OBJECTIVE:  Vitals:   06/04/18 1405 06/04/18 1408  BP: 120/73   Pulse: 86   Resp: 16   Temp: 98.6 F (37 C)   TempSrc: Tympanic   SpO2: 100%   Weight:  77.1 kg     General appearance: alert; appears fatigued HEENT: nasal congestion; clear runny nose; throat irritation secondary to post-nasal drainage Neck: supple without LAD CV: RRR Lungs: unlabored respirations, symmetrical air entry without wheezing; cough: absent Abd: soft Ext: no LE edema Skin: warm and dry Psychological: alert and cooperative; normal mood and affect  No Known Allergies  Past Medical History:  Diagnosis Date  . History of MRSA infection ~ 2008   arm  . Laceration of finger of left hand with tendon involvement 09/10/2014   nerve/artery/tendon involvement  . Medical history non-contributory      Social History   Socioeconomic History  . Marital status: Single    Spouse name: Not on file  . Number of children: Not on file  . Years of education: Not on file  . Highest education level: Not on file  Occupational History  . Not on file  Social Needs  . Financial resource strain: Not on file  . Food insecurity:    Worry: Not on file    Inability: Not on file  . Transportation needs:    Medical: Not on file    Non-medical: Not on file  Tobacco Use  . Smoking status: Unknown If Ever Smoked  . Smokeless tobacco: Never Used  Substance and Sexual Activity  . Alcohol use: No  . Drug use: No  . Sexual activity: Yes    Birth control/protection: Rhythm  Lifestyle  . Physical activity:    Days per week: Not on file    Minutes per session: Not on file  . Stress: Not on file  Relationships  . Social connections:    Talks on phone: Not on file    Gets together: Not on file    Attends religious service: Not on file    Active member of club or organization: Not on file    Attends meetings of clubs or organizations: Not on file    Relationship status: Not on file  . Intimate partner violence:    Fear of current or ex partner: Not on file    Emotionally abused: Not on file    Physically abused: Not on file  Forced sexual activity: Not on file  Other Topics Concern  . Not on file  Social History Narrative  . Not on file           Mardella Layman, MD 06/06/18 [id number]
